# Patient Record
Sex: Female | Born: 1965
Health system: Southern US, Community
[De-identification: ages and names within clinical notes are randomized; demographics above are authoritative.]

## PROBLEM LIST (undated history)

## (undated) DIAGNOSIS — T7840XA Allergy, unspecified, initial encounter: Secondary | ICD-10-CM

## (undated) DIAGNOSIS — M1990 Unspecified osteoarthritis, unspecified site: Secondary | ICD-10-CM

## (undated) HISTORY — PX: UTERINE FIBROID EMBOLIZATION: SHX825

## (undated) HISTORY — DX: Allergy, unspecified, initial encounter: T78.40XA

## (undated) HISTORY — PX: TUBAL LIGATION: SHX77

---

## 1998-06-24 ENCOUNTER — Other Ambulatory Visit: Admission: RE | Admit: 1998-06-24 | Discharge: 1998-06-24 | Payer: Self-pay | Admitting: Obstetrics and Gynecology

## 2002-01-25 ENCOUNTER — Other Ambulatory Visit: Admission: RE | Admit: 2002-01-25 | Discharge: 2002-01-25 | Payer: Self-pay | Admitting: *Deleted

## 2003-04-02 ENCOUNTER — Other Ambulatory Visit: Admission: RE | Admit: 2003-04-02 | Discharge: 2003-04-02 | Payer: Self-pay | Admitting: *Deleted

## 2003-07-05 ENCOUNTER — Encounter (INDEPENDENT_AMBULATORY_CARE_PROVIDER_SITE_OTHER): Payer: Self-pay | Admitting: *Deleted

## 2003-07-05 ENCOUNTER — Ambulatory Visit (HOSPITAL_COMMUNITY): Admission: RE | Admit: 2003-07-05 | Discharge: 2003-07-05 | Payer: Self-pay | Admitting: *Deleted

## 2006-04-08 ENCOUNTER — Ambulatory Visit (HOSPITAL_COMMUNITY): Admission: RE | Admit: 2006-04-08 | Discharge: 2006-04-08 | Payer: Self-pay | Admitting: Family Medicine

## 2008-07-31 ENCOUNTER — Encounter: Admission: RE | Admit: 2008-07-31 | Discharge: 2008-07-31 | Payer: Self-pay | Admitting: Obstetrics and Gynecology

## 2008-08-04 ENCOUNTER — Encounter: Admission: RE | Admit: 2008-08-04 | Discharge: 2008-08-04 | Payer: Self-pay | Admitting: Interventional Radiology

## 2008-08-22 ENCOUNTER — Encounter: Admission: RE | Admit: 2008-08-22 | Discharge: 2008-08-22 | Payer: Self-pay | Admitting: Interventional Radiology

## 2008-09-10 ENCOUNTER — Ambulatory Visit (HOSPITAL_COMMUNITY): Admission: RE | Admit: 2008-09-10 | Discharge: 2008-09-11 | Payer: Self-pay | Admitting: Interventional Radiology

## 2008-10-01 ENCOUNTER — Encounter: Admission: RE | Admit: 2008-10-01 | Discharge: 2008-10-01 | Payer: Self-pay | Admitting: Interventional Radiology

## 2009-03-27 ENCOUNTER — Encounter: Admission: RE | Admit: 2009-03-27 | Discharge: 2009-03-27 | Payer: Self-pay | Admitting: Obstetrics and Gynecology

## 2009-04-02 ENCOUNTER — Encounter: Admission: RE | Admit: 2009-04-02 | Discharge: 2009-04-02 | Payer: Self-pay | Admitting: Interventional Radiology

## 2010-08-25 ENCOUNTER — Encounter: Admission: RE | Admit: 2010-08-25 | Discharge: 2010-08-25 | Payer: Self-pay | Admitting: Obstetrics and Gynecology

## 2011-05-07 NOTE — Op Note (Signed)
NAMEALTHIA, Rachel Graves                       ACCOUNT NO.:  [id number]   MEDICAL RECORD NO.:  [id number]                   PATIENT TYPE:  AMB   LOCATION:  SDC                                  FACILITY:  [facility]   PHYSICIAN:  Pershing Cox, M.D.            DATE OF BIRTH:  03-27-1966   DATE OF PROCEDURE:  07/05/2003  DATE OF DISCHARGE:                                 OPERATIVE REPORT   PREOPERATIVE DIAGNOSIS:  Menorrhagia with normal hydrosonogram.   POSTOPERATIVE DIAGNOSIS:  Menorrhagia with normal hydrosonogram.   ANESTHESIA:  MAC plus Marcaine paracervical block.   SURGEON:  Pershing Cox, M.D.   PROCEDURE:  Exam under anesthesia, dilation and curettage, hysteroscopy with  endometrial cryoablation.   COMPLICATIONS:  None.   INDICATIONS FOR PROCEDURE:  Rachel Graves Melendrez is a 45 year old female.  She  has had a tubal ligation in the past and has been having regular menstrual  periods but with three very heavy days of bleeding associated with this.  This has required pad change every 30 to 40 minutes.  She occasionally soils  at night.  She finds her symptoms debilitating.  Sonohysterogram was  performed and showed a normal endometrial lining and for this reason, the  patient was counseled for her options and is brought to the operating room  for endometrial cryoablation.   PROCEDURE:  The patient was brought to the operating room with an IV in  place.  She received a gram of Ancef in the holding area.  Supine on the OR  table, IV sedation was administered followed by placement of an LMA.  She  was then prepped with a solution of Hibiclens.  A Foley catheter was used to  empty the bladder because of an allergy to latex.  She was then was draped  for a sterile vaginal procedure.  A bivalve speculum was inserted into the  vagina.  The cervix was visualized.  Marcaine 0.25% was injected into the  anterior cervix which was grasped with a single tooth tenaculum.  20 mL of  0.25% Marcaine were injected into the paracervical tissues at the 3, 4, 7,  and 8 positions to establish a paracervical block.  The endocervical  curettings were collected.  The uterine uterine sound was then passed to a  depth of 9 cm.  Serial Pratt dilators were placed to dilate the endocervix.  A hysteroscope was inserted into the endometrial cavity and using Sorbitol  irrigation, the upper fundus and ostia were visualized.  There was no  evidence of a submucosal myoma which had been suggested on her sonogram.  With complete visualization and no evidence of significant tissue, the  cryoprobe was prefrozen and then inserted into the endometrial cavity.  First the left upper cornua were treated with a six minute freeze followed  by a 1 1/2 minute thaw.  The probe was rotated into the right fornix and the  bivalve speculum was  rotated so that there could be a good lateral extension  of the probe.  A six minute freeze was followed by 1 1/2 minute thaw.  I  then retracted the probe to a depth of 7 cm and carried out a midline  freeze, again with a six minute freeze followed by 1 1/2 minute thaw.  The  probe was removed after the final thaw.  The tenaculum was removed from the  uterus.  The patient was extubated and taken to the recovery room in  excellent condition.                                              Pershing Cox, M.D.   MAJ/MEDQ  D:  07/05/2003  T:  07/06/2003  Job:  [id number]

## 2011-09-20 LAB — CBC
MCV: 89.4
Platelets: 274

## 2012-06-12 ENCOUNTER — Other Ambulatory Visit: Payer: Self-pay | Admitting: Obstetrics and Gynecology

## 2012-06-29 ENCOUNTER — Other Ambulatory Visit: Payer: Self-pay | Admitting: Obstetrics and Gynecology

## 2012-06-29 DIAGNOSIS — Z1231 Encounter for screening mammogram for malignant neoplasm of breast: Secondary | ICD-10-CM

## 2012-07-25 ENCOUNTER — Ambulatory Visit
Admission: RE | Admit: 2012-07-25 | Discharge: 2012-07-25 | Disposition: A | Discharge status: Home / Self Care | Payer: 59 | Source: Ambulatory Visit | Attending: Obstetrics and Gynecology | Admitting: Obstetrics and Gynecology

## 2012-07-25 DIAGNOSIS — Z1231 Encounter for screening mammogram for malignant neoplasm of breast: Secondary | ICD-10-CM

## 2013-08-03 ENCOUNTER — Other Ambulatory Visit: Payer: Self-pay

## 2013-08-03 DIAGNOSIS — Z1231 Encounter for screening mammogram for malignant neoplasm of breast: Secondary | ICD-10-CM

## 2013-08-16 ENCOUNTER — Ambulatory Visit
Admission: RE | Admit: 2013-08-16 | Discharge: 2013-08-16 | Disposition: A | Discharge status: Home / Self Care | Payer: 59 | Source: Ambulatory Visit

## 2013-08-16 DIAGNOSIS — Z1231 Encounter for screening mammogram for malignant neoplasm of breast: Secondary | ICD-10-CM

## 2013-11-24 ENCOUNTER — Emergency Department (HOSPITAL_COMMUNITY)
Admission: EM | Admit: 2013-11-24 | Discharge: 2013-11-24 | Disposition: A | Discharge status: Home / Self Care | Payer: 59 | Attending: Emergency Medicine | Admitting: Emergency Medicine

## 2013-11-24 ENCOUNTER — Encounter (HOSPITAL_COMMUNITY): Payer: Self-pay | Admitting: Emergency Medicine

## 2013-11-24 DIAGNOSIS — S0990XA Unspecified injury of head, initial encounter: Secondary | ICD-10-CM | POA: Insufficient documentation

## 2013-11-24 DIAGNOSIS — Z91040 Latex allergy status: Secondary | ICD-10-CM | POA: Insufficient documentation

## 2013-11-24 DIAGNOSIS — Z79899 Other long term (current) drug therapy: Secondary | ICD-10-CM | POA: Insufficient documentation

## 2013-11-24 DIAGNOSIS — Z8739 Personal history of other diseases of the musculoskeletal system and connective tissue: Secondary | ICD-10-CM | POA: Insufficient documentation

## 2013-11-24 DIAGNOSIS — Y92410 Unspecified street and highway as the place of occurrence of the external cause: Secondary | ICD-10-CM | POA: Insufficient documentation

## 2013-11-24 DIAGNOSIS — Y9389 Activity, other specified: Secondary | ICD-10-CM | POA: Insufficient documentation

## 2013-11-24 DIAGNOSIS — Z880 Allergy status to penicillin: Secondary | ICD-10-CM | POA: Insufficient documentation

## 2013-11-24 DIAGNOSIS — S8990XA Unspecified injury of unspecified lower leg, initial encounter: Secondary | ICD-10-CM | POA: Insufficient documentation

## 2013-11-24 HISTORY — DX: Unspecified osteoarthritis, unspecified site: M19.90
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
## 2013-11-25 NOTE — ED Provider Notes (Signed)
  Medical screening examination/treatment/procedure(s) were performed by non-physician practitioner and as supervising physician I was immediately available for consultation/collaboration.  EKG Interpretation   None          Avey Mcmanamon, MD 11/25/13 0023 

## 2014-04-12 ENCOUNTER — Other Ambulatory Visit (HOSPITAL_COMMUNITY): Payer: Self-pay

## 2014-04-12 DIAGNOSIS — J45909 Unspecified asthma, uncomplicated: Secondary | ICD-10-CM

## 2014-05-23 ENCOUNTER — Ambulatory Visit (HOSPITAL_COMMUNITY)
Admission: RE | Admit: 2014-05-23 | Discharge: 2014-05-23 | Disposition: A | Discharge status: Home / Self Care | Payer: 59 | Source: Ambulatory Visit | Attending: Family Medicine | Admitting: Family Medicine

## 2014-05-23 DIAGNOSIS — J45909 Unspecified asthma, uncomplicated: Secondary | ICD-10-CM | POA: Insufficient documentation

## 2014-05-23 MED ORDER — ALBUTEROL SULFATE (2.5 MG/3ML) 0.083% IN NEBU
2.5000 mg | INHALATION_SOLUTION | Freq: Once | RESPIRATORY_TRACT | Status: AC
  Administered 2014-05-23: 2.5 mg via RESPIRATORY_TRACT

## 2014-06-17 LAB — PULMONARY FUNCTION TEST
DL/VA % PRED: 108 %
DL/VA: 5.46 ml/min/mmHg/L
DLCO UNC % PRED: 90 %
DLCO unc: 24.33 ml/min/mmHg
FEF 25-75 Post: 2.76 L/sec
FEF 25-75 Pre: 2.08 L/sec
FEF2575-%Change-Post: 32 %
FEF2575-%PRED-POST: 102 %
FEF2575-%Pred-Pre: 77 %
FEV1-%Change-Post: 11 %
FEV1-%Pred-Post: 101 %
FEV1-%Pred-Pre: 91 %
FEV1-PRE: 2.33 L
FEV1-Post: 2.61 L
FEV1FVC-%Change-Post: 2 %
FEV1FVC-%PRED-PRE: 92 %
FEV6-%Change-Post: 9 %
FEV6-%PRED-POST: 108 %
FEV6-%Pred-Pre: 98 %
FEV6-POST: 3.35 L
FEV6-Pre: 3.07 L
FEV6FVC-%CHANGE-POST: 0 %
FEV6FVC-%PRED-POST: 103 %
FEV6FVC-%Pred-Pre: 102 %
FVC-%Change-Post: 8 %
FVC-%PRED-PRE: 96 %
FVC-%Pred-Post: 105 %
FVC-PRE: 3.08 L
FVC-Post: 3.35 L
POST FEV1/FVC RATIO: 78 %
PRE FEV6/FVC RATIO: 99 %
Post FEV6/FVC ratio: 100 %
Pre FEV1/FVC ratio: 76 %
RV % pred: 89 %
RV: 1.65 L
TLC % PRED: 93 %
TLC: 4.99 L

## 2014-09-08 ENCOUNTER — Ambulatory Visit (INDEPENDENT_AMBULATORY_CARE_PROVIDER_SITE_OTHER): Payer: 59

## 2014-09-08 ENCOUNTER — Ambulatory Visit (INDEPENDENT_AMBULATORY_CARE_PROVIDER_SITE_OTHER): Payer: 59 | Admitting: Family Medicine

## 2014-09-08 VITALS — BP 122/76 | HR 72 | Temp 97.8°F | Resp 18 | Ht 66.0 in | Wt 213.6 lb

## 2014-09-08 DIAGNOSIS — S93609A Unspecified sprain of unspecified foot, initial encounter: Secondary | ICD-10-CM

## 2014-09-08 DIAGNOSIS — S93602A Unspecified sprain of left foot, initial encounter: Secondary | ICD-10-CM

## 2014-09-08 MED ORDER — PREDNISONE 20 MG PO TABS: 40.0000 mg | ORAL_TABLET | Freq: Every day | ORAL | Status: DC

## 2014-09-08 NOTE — Progress Notes (Signed)
Patient ID: Rachel Graves MRN: [id number], DOB: 16-Sep-1966, 48 y.o. Date of Encounter: 09/08/2014, 10:47 AM  Primary Physician: Astrid Divine, MD  Chief Complaint:  Chief Complaint  Patient presents with  . Foot pain    last week of August--left side of the left foot--sharp pain--limited ROM--painful with movement  . Ankle Pain    left ankle   This chart was scribed for Dr. Elvina Sidle, MD by Jarvis Morgan, Medical Scribe. This patient was seen in Room 11 and the patient's care was started at 10:50 AM.   HPI: 48 y.o. year old female with history below presents with of constant, sharp, gradually worsening, left foot pain for approximately 3 weeks. She states that the pain is localized in the top of her foot. Pt reports that about 1 month ago that she tripped on some pavers and has been having pain ever since. She states that the pain is exacerbated by movement and that the pain gets gradually worse the longer that she is on her feet. Pt also notes she is not able to move it as much as before. She has been taking Ibuprofen with no relief. She denies any swelling or color change to the area.  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 Arthritis   . Allergy      Home Meds: Prior to Admission medications   Medication Sig Start Date End Date Taking? Authorizing Provider  albuterol (PROVENTIL HFA;VENTOLIN HFA) 108 (90 BASE) MCG/ACT inhaler Inhale into the lungs every 6 (six) hours as needed for wheezing or shortness of breath.   Yes Historical Provider, MD    Allergies:  Allergies  Allergen Reactions  . Latex Hives  . Penicillins     History   Social History  . Marital Status: Married    Spouse Name: N/A    Number of Children: N/A  . Years of Education: N/A   Occupational History  . Not on file.   Social History Main Topics  . Smoking status: Never Smoker   . Smokeless tobacco: Not on file  . Alcohol Use: No   . Drug Use: No  . Sexual Activity: Yes   Other Topics Concern  . Not on file   Social History Narrative  . No narrative on file     Review of Systems: Constitutional: negative for chills, fever, night sweats, weight changes, or fatigue  HEENT: negative for vision changes, hearing loss, congestion, rhinorrhea, ST, epistaxis, or sinus pressure Cardiovascular: negative for chest pain or palpitations Respiratory: negative for hemoptysis, wheezing, shortness of breath, or cough Abdominal: negative for abdominal pain, nausea, vomiting, diarrhea, or constipation Dermatological: negative for rash, or color change Neurologic: negative for headache, dizziness, or syncope Musculoskeletal: positive for arthralgias (left foot). Negative for joint swelling. All other systems reviewed and are otherwise negative with the exception to those above and in the HPI.   Physical Exam: Blood pressure 122/76, pulse 72, temperature 97.8 F (36.6 C), temperature source Oral, resp. rate 18, height  (1.676 m), weight 213 lb 9.6 oz (96.888 kg), SpO2 100.00%., Body mass index is 34.49 kg/(m^2). General: Well developed, well nourished, in no acute distress. Head: Normocephalic, atraumatic, eyes without discharge, sclera non-icteric, nares are without discharge. Bilateral auditory canals clear, TM's are without perforation, pearly grey and translucent with reflective cone of light bilaterally. Oral cavity moist, posterior pharynx without exudate, erythema, peritonsillar abscess, or post nasal drip.  Neck: Supple.  No thyromegaly. Full ROM. No lymphadenopathy. Lungs: Clear bilaterally to auscultation without wheezes, rales, or rhonchi. Breathing is unlabored. Heart: RRR with S1 S2. No murmurs, rubs, or gallops appreciated. Abdomen: Soft, non-tender, non-distended with normoactive bowel sounds. No hepatomegaly. No rebound/guarding. No obvious abdominal masses. Msk:  Strength and tone normal for  age. Extremities/Skin: Warm and dry. No clubbing or cyanosis. No edema. No rashes or suspicious lesions. Neuro: Alert and oriented X 3. Moves all extremities spontaneously. Gait is normal. CNII-XII grossly in tact. Psych:  Responds to questions appropriately with a normal affect.   Labs: [facility] reading (PRIMARY) by  Dr. Milus Glazier: Normal left foot.    ASSESSMENT AND PLAN:  48 y.o. year old female with   1. Foot sprain, left, initial encounter    Foot sprain, left, initial encounter - Plan: DG Foot Complete Left, predniSONE (DELTASONE) 20 MG tablet     Signed, Elvina Sidle, MD 09/08/2014 10:47 AM

## 2014-10-24 ENCOUNTER — Other Ambulatory Visit: Payer: Self-pay

## 2014-10-24 DIAGNOSIS — Z1231 Encounter for screening mammogram for malignant neoplasm of breast: Secondary | ICD-10-CM

## 2014-11-06 ENCOUNTER — Ambulatory Visit
Admission: RE | Admit: 2014-11-06 | Discharge: 2014-11-06 | Disposition: A | Discharge status: Home / Self Care | Payer: 59 | Source: Ambulatory Visit

## 2014-11-06 DIAGNOSIS — Z1231 Encounter for screening mammogram for malignant neoplasm of breast: Secondary | ICD-10-CM

## 2014-11-07 ENCOUNTER — Ambulatory Visit: Payer: 59

## 2016-01-30 ENCOUNTER — Other Ambulatory Visit: Payer: Self-pay

## 2016-01-30 DIAGNOSIS — Z1231 Encounter for screening mammogram for malignant neoplasm of breast: Secondary | ICD-10-CM

## 2016-02-11 ENCOUNTER — Ambulatory Visit
Admission: RE | Admit: 2016-02-11 | Discharge: 2016-02-11 | Disposition: A | Discharge status: Home / Self Care | Payer: BLUE CROSS/BLUE SHIELD | Source: Ambulatory Visit

## 2016-02-11 DIAGNOSIS — Z1231 Encounter for screening mammogram for malignant neoplasm of breast: Secondary | ICD-10-CM

## 2017-12-23 DIAGNOSIS — Z23 Encounter for immunization: Secondary | ICD-10-CM | POA: Diagnosis not present

## 2017-12-23 DIAGNOSIS — Z136 Encounter for screening for cardiovascular disorders: Secondary | ICD-10-CM | POA: Diagnosis not present

## 2017-12-23 DIAGNOSIS — Z0000 Encounter for general adult medical examination without abnormal findings: Secondary | ICD-10-CM | POA: Diagnosis not present

## 2018-01-10 DIAGNOSIS — Z800 Family history of malignant neoplasm of digestive organs: Secondary | ICD-10-CM | POA: Diagnosis not present

## 2018-01-10 DIAGNOSIS — Z1211 Encounter for screening for malignant neoplasm of colon: Secondary | ICD-10-CM | POA: Diagnosis not present

## 2018-02-24 DIAGNOSIS — Z1211 Encounter for screening for malignant neoplasm of colon: Secondary | ICD-10-CM | POA: Diagnosis not present

## 2018-02-24 DIAGNOSIS — Z800 Family history of malignant neoplasm of digestive organs: Secondary | ICD-10-CM | POA: Diagnosis not present

## 2018-03-01 ENCOUNTER — Other Ambulatory Visit: Payer: Self-pay | Admitting: Family Medicine

## 2018-03-01 DIAGNOSIS — Z1231 Encounter for screening mammogram for malignant neoplasm of breast: Secondary | ICD-10-CM

## 2018-03-04 ENCOUNTER — Encounter (HOSPITAL_COMMUNITY): Payer: Self-pay

## 2018-03-04 ENCOUNTER — Ambulatory Visit (HOSPITAL_COMMUNITY)
Admission: EM | Admit: 2018-03-04 | Discharge: 2018-03-04 | Disposition: A | Discharge status: Home / Self Care | Payer: BLUE CROSS/BLUE SHIELD | Attending: Family Medicine | Admitting: Family Medicine

## 2018-03-04 ENCOUNTER — Other Ambulatory Visit: Payer: Self-pay

## 2018-03-04 DIAGNOSIS — M546 Pain in thoracic spine: Secondary | ICD-10-CM

## 2018-03-04 MED ORDER — CYCLOBENZAPRINE HCL 10 MG PO TABS: 10.0000 mg | ORAL_TABLET | Freq: Two times a day (BID) | ORAL | 0 refills | Status: AC | PRN
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
## 2018-03-09 DIAGNOSIS — R030 Elevated blood-pressure reading, without diagnosis of hypertension: Secondary | ICD-10-CM | POA: Diagnosis not present

## 2018-03-09 DIAGNOSIS — R0789 Other chest pain: Secondary | ICD-10-CM | POA: Diagnosis not present

## 2018-03-17 ENCOUNTER — Ambulatory Visit: Payer: BLUE CROSS/BLUE SHIELD

## 2018-04-11 DIAGNOSIS — R030 Elevated blood-pressure reading, without diagnosis of hypertension: Secondary | ICD-10-CM | POA: Diagnosis not present

## 2018-04-24 ENCOUNTER — Ambulatory Visit
Admission: RE | Admit: 2018-04-24 | Discharge: 2018-04-24 | Disposition: A | Discharge status: Home / Self Care | Payer: Self-pay | Source: Ambulatory Visit | Attending: Family Medicine | Admitting: Family Medicine

## 2018-04-24 DIAGNOSIS — Z1231 Encounter for screening mammogram for malignant neoplasm of breast: Secondary | ICD-10-CM | POA: Diagnosis not present

## 2019-09-26 ENCOUNTER — Other Ambulatory Visit: Payer: Self-pay | Admitting: Family Medicine

## 2019-09-26 DIAGNOSIS — Z1231 Encounter for screening mammogram for malignant neoplasm of breast: Secondary | ICD-10-CM

## 2019-10-19 ENCOUNTER — Ambulatory Visit
Admission: RE | Admit: 2019-10-19 | Discharge: 2019-10-19 | Disposition: A | Discharge status: Home / Self Care | Payer: 59 | Source: Ambulatory Visit | Attending: Family Medicine | Admitting: Family Medicine

## 2019-10-19 ENCOUNTER — Other Ambulatory Visit: Payer: Self-pay

## 2019-10-19 DIAGNOSIS — Z1231 Encounter for screening mammogram for malignant neoplasm of breast: Secondary | ICD-10-CM

## 2021-01-01 ENCOUNTER — Other Ambulatory Visit: Payer: Self-pay | Admitting: Family Medicine

## 2021-01-01 DIAGNOSIS — Z0000 Encounter for general adult medical examination without abnormal findings: Secondary | ICD-10-CM

## 2021-02-13 ENCOUNTER — Ambulatory Visit
Admission: RE | Admit: 2021-02-13 | Discharge: 2021-02-13 | Disposition: A | Discharge status: Home / Self Care | Payer: 59 | Source: Ambulatory Visit | Attending: Family Medicine | Admitting: Family Medicine

## 2021-02-13 ENCOUNTER — Other Ambulatory Visit: Payer: Self-pay

## 2021-02-13 DIAGNOSIS — Z0000 Encounter for general adult medical examination without abnormal findings: Secondary | ICD-10-CM

## 2021-07-17 IMAGING — MG DIGITAL SCREENING BILAT W/ CAD
4 series · 4 of 4 positions shown · non-contrast
Comparison: Previous exam(s).

CLINICAL DATA: Screening.

EXAM:
DIGITAL SCREENING BILATERAL MAMMOGRAM WITH CAD
TECHNIQUE: Bilateral screening digital craniocaudal and mediolateral oblique
mammograms were obtained. The images were evaluated with
computer-aided detection.

[L MLO]
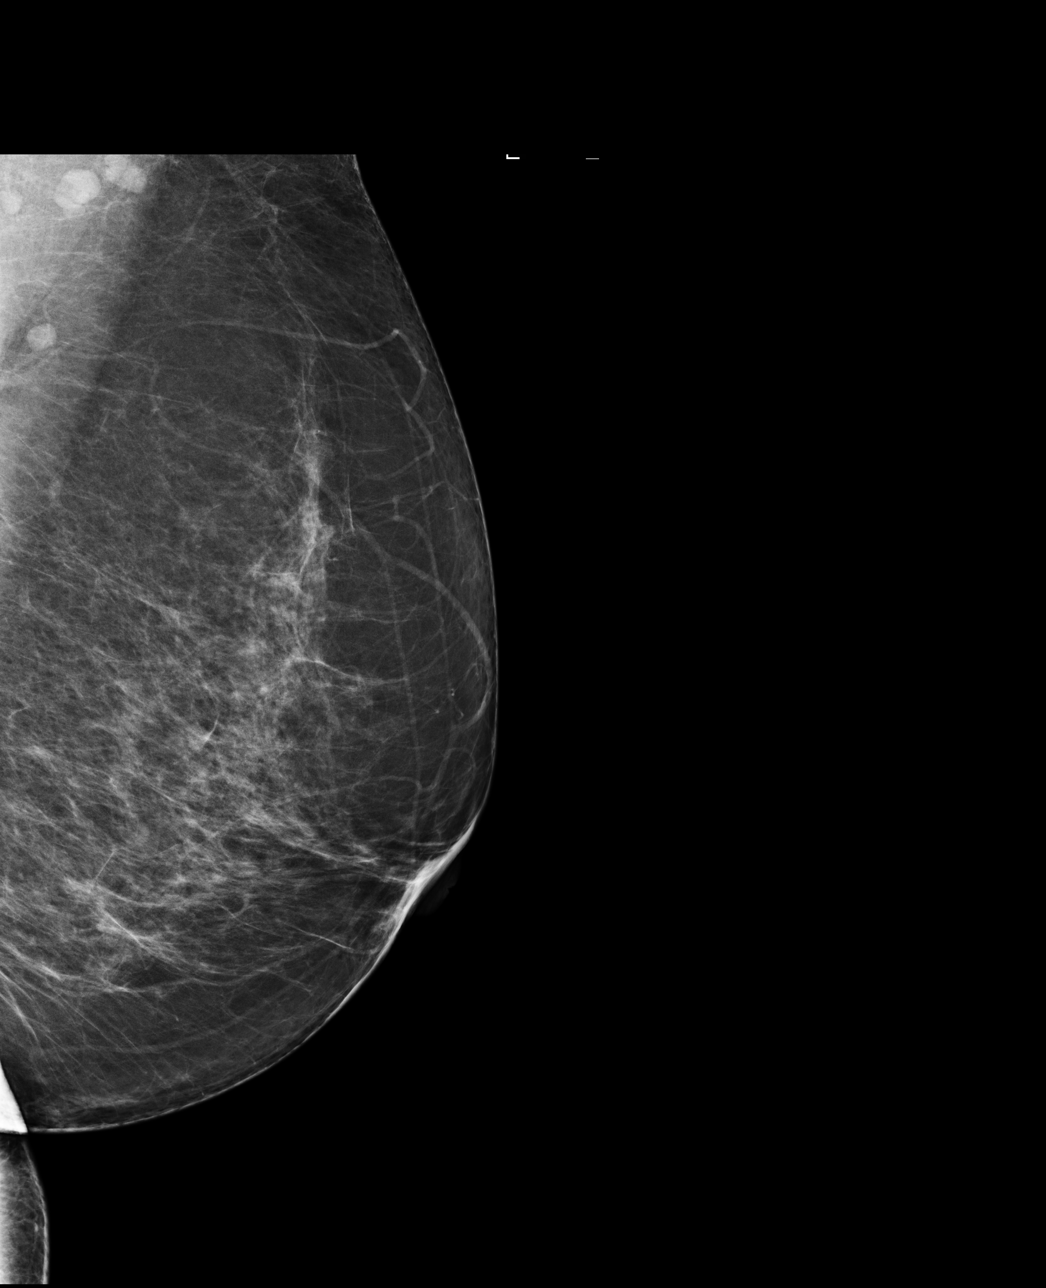

[L CC]
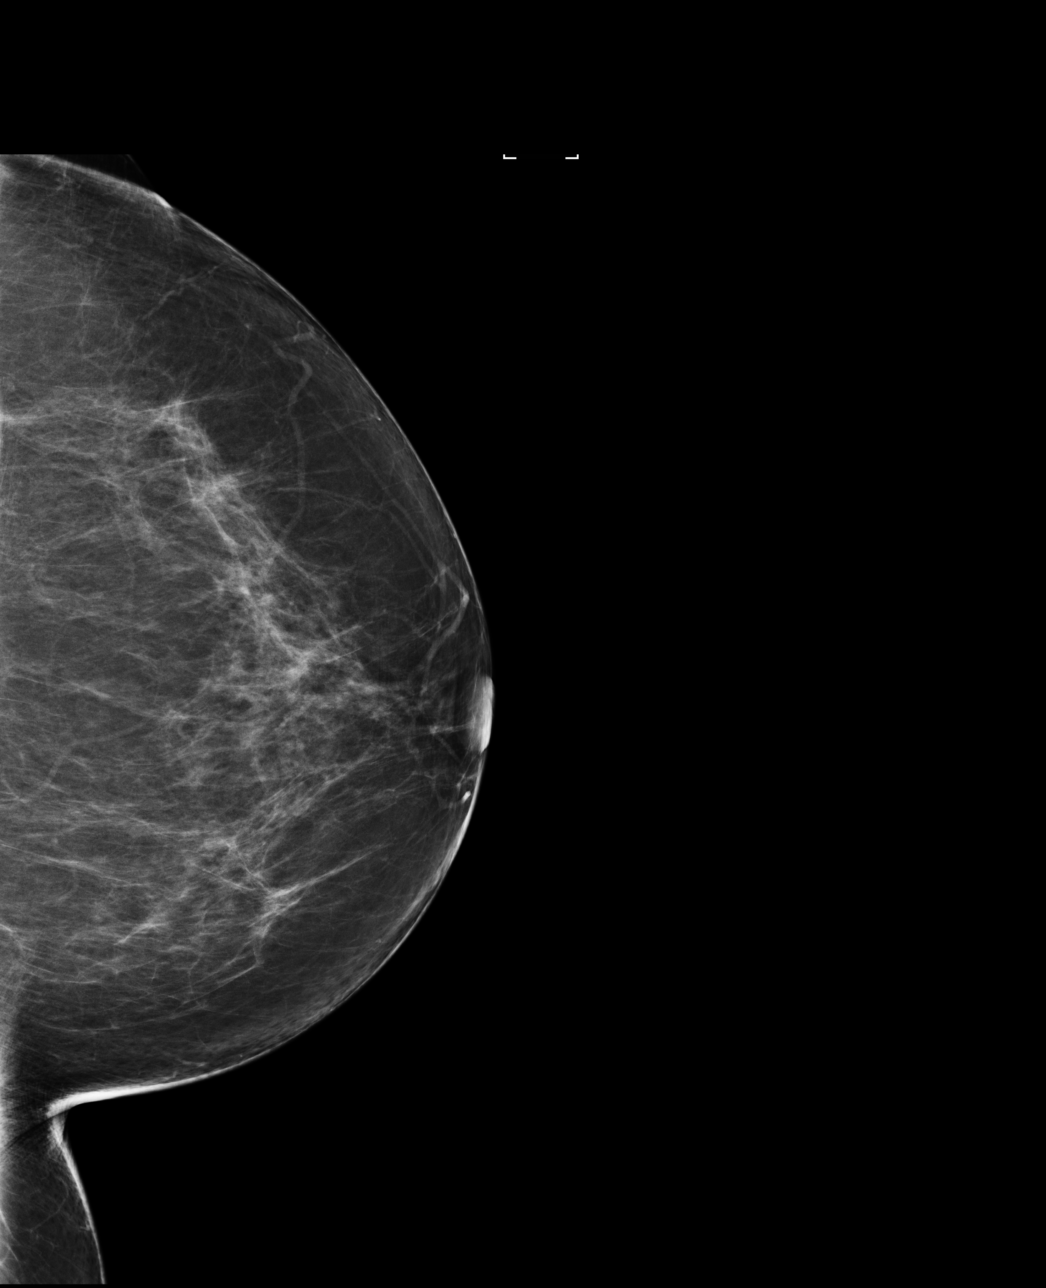

[R CC]
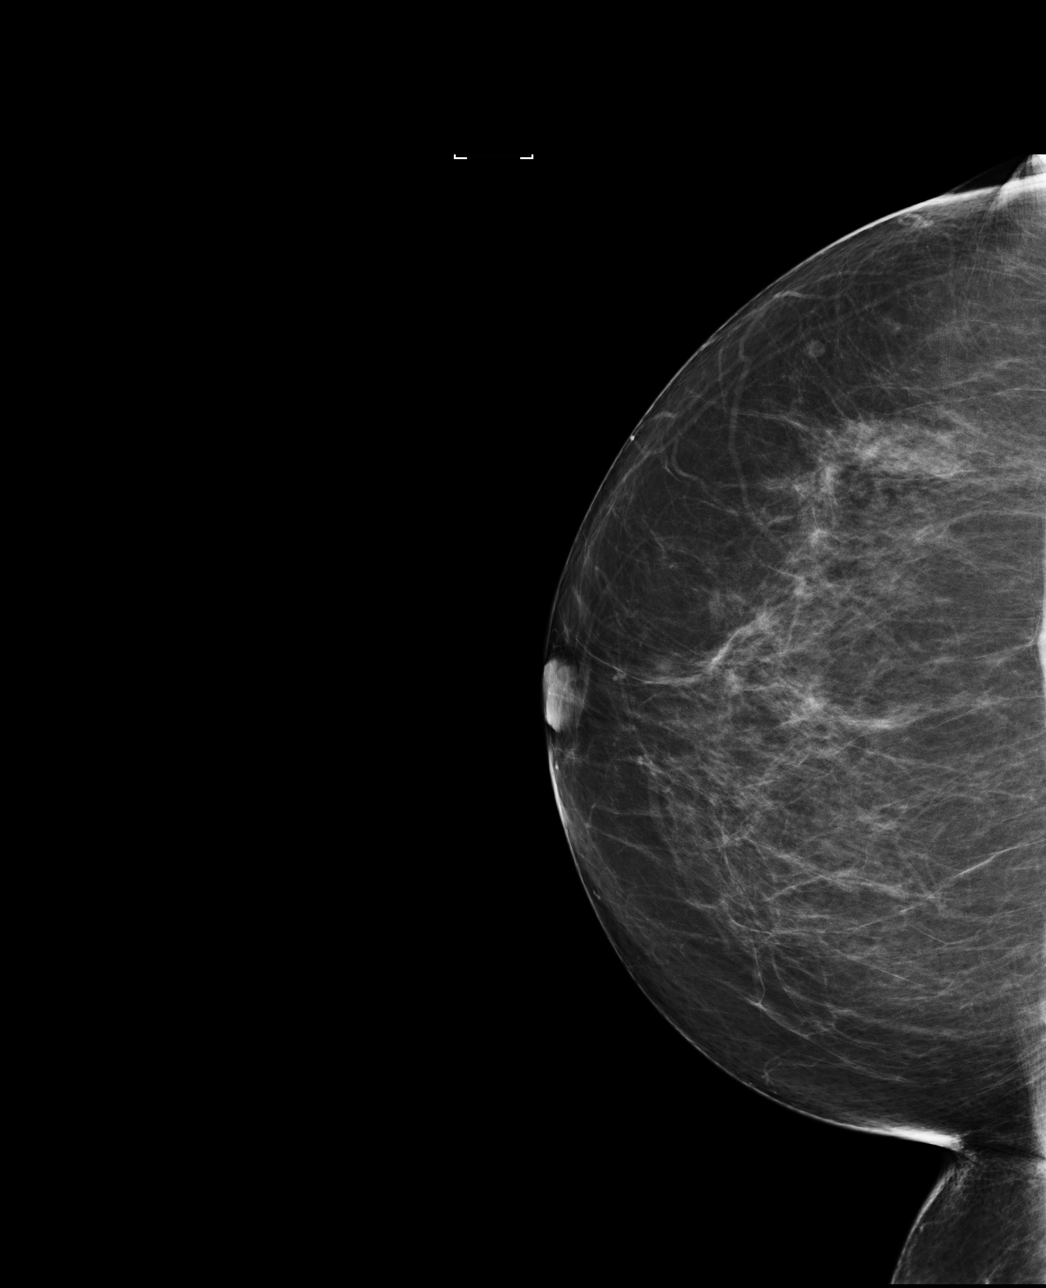

[R MLO]
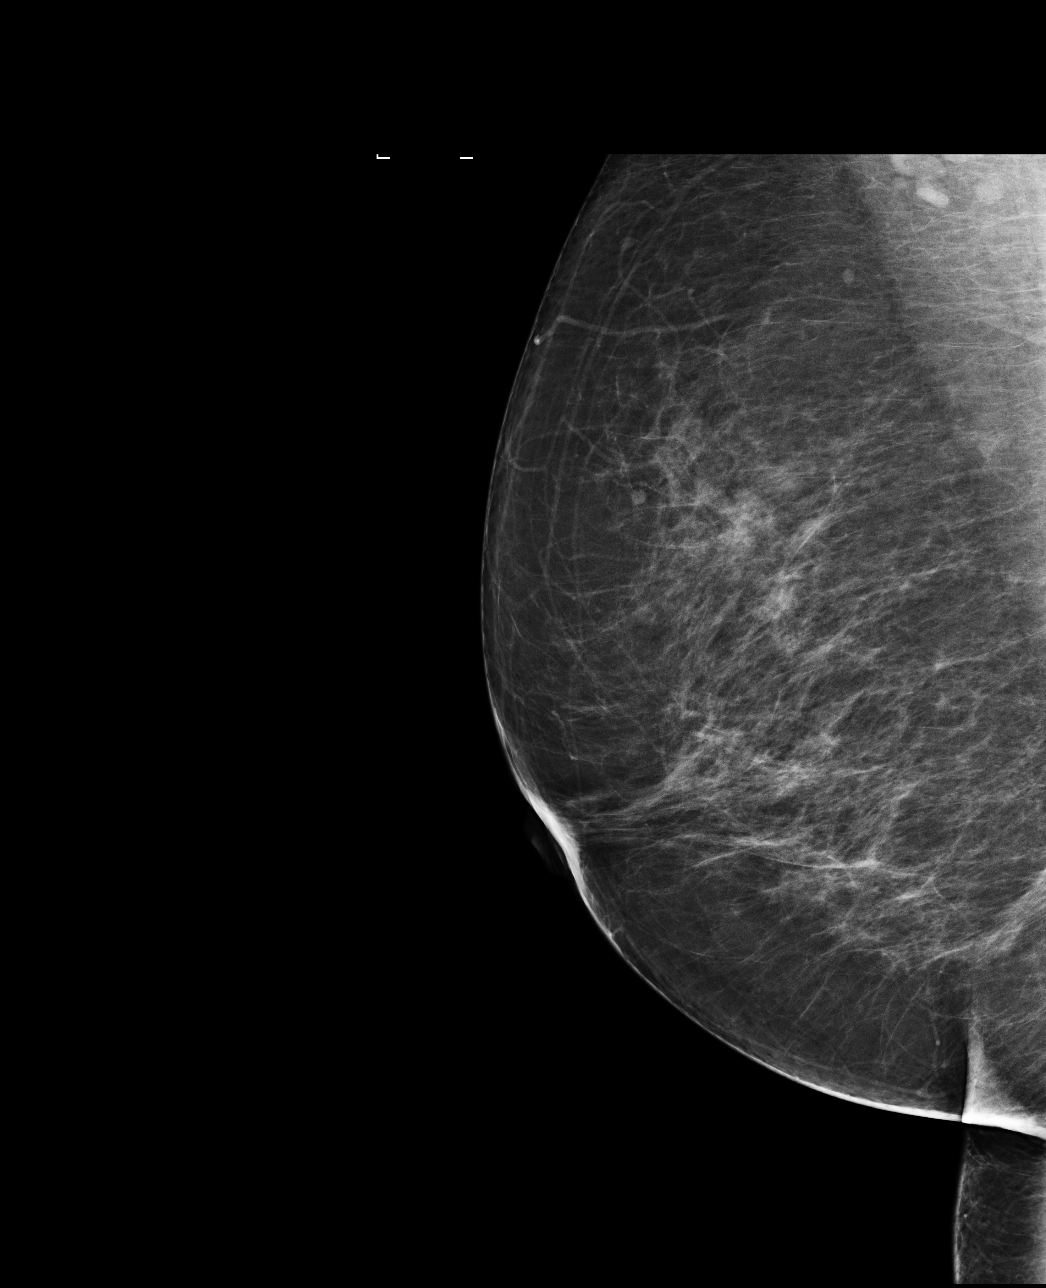

[4 of 4 positions shown; findings below may reference images not displayed]

ACR Breast Density Category b: There are scattered areas of
fibroglandular density.
FINDINGS: There are no findings suspicious for malignancy. The images were
evaluated with computer-aided detection.
IMPRESSION: No mammographic evidence of malignancy. A result letter of this
screening mammogram will be mailed directly to the patient.

RECOMMENDATION:
Screening mammogram in one year. (Code:XC-Q-XW6)

BI-RADS CATEGORY  1: Negative.

## 2022-03-26 ENCOUNTER — Other Ambulatory Visit: Payer: Self-pay | Admitting: Family Medicine

## 2022-03-26 DIAGNOSIS — Z1231 Encounter for screening mammogram for malignant neoplasm of breast: Secondary | ICD-10-CM

## 2022-04-13 ENCOUNTER — Ambulatory Visit: Payer: 59

## 2022-07-30 ENCOUNTER — Ambulatory Visit
Admission: RE | Admit: 2022-07-30 | Discharge: 2022-07-30 | Disposition: A | Discharge status: Home / Self Care | Payer: 59 | Source: Ambulatory Visit | Attending: Family Medicine | Admitting: Family Medicine

## 2022-07-30 ENCOUNTER — Encounter: Payer: Self-pay | Admitting: Radiology

## 2022-07-30 DIAGNOSIS — Z1231 Encounter for screening mammogram for malignant neoplasm of breast: Secondary | ICD-10-CM

## 2022-08-03 ENCOUNTER — Other Ambulatory Visit: Payer: Self-pay | Admitting: Family Medicine

## 2022-08-03 DIAGNOSIS — R928 Other abnormal and inconclusive findings on diagnostic imaging of breast: Secondary | ICD-10-CM

## 2022-08-12 ENCOUNTER — Ambulatory Visit
Admission: RE | Admit: 2022-08-12 | Discharge: 2022-08-12 | Disposition: A | Discharge status: Home / Self Care | Payer: 59 | Source: Ambulatory Visit | Attending: Family Medicine | Admitting: Family Medicine

## 2022-08-12 DIAGNOSIS — R928 Other abnormal and inconclusive findings on diagnostic imaging of breast: Secondary | ICD-10-CM

## 2023-09-22 ENCOUNTER — Other Ambulatory Visit: Payer: Self-pay | Admitting: Internal Medicine

## 2023-09-22 DIAGNOSIS — Z1231 Encounter for screening mammogram for malignant neoplasm of breast: Secondary | ICD-10-CM

## 2023-10-18 ENCOUNTER — Ambulatory Visit
Admission: RE | Admit: 2023-10-18 | Discharge: 2023-10-18 | Disposition: A | Discharge status: Home / Self Care | Payer: 59 | Source: Ambulatory Visit | Attending: Internal Medicine | Admitting: Internal Medicine

## 2023-10-18 DIAGNOSIS — Z1231 Encounter for screening mammogram for malignant neoplasm of breast: Secondary | ICD-10-CM

## 2023-10-19 ENCOUNTER — Ambulatory Visit: Payer: 59

## 2023-11-09 ENCOUNTER — Ambulatory Visit: Payer: 59

## 2024-10-29 ENCOUNTER — Other Ambulatory Visit: Payer: Self-pay | Admitting: Internal Medicine

## 2024-10-29 DIAGNOSIS — Z1231 Encounter for screening mammogram for malignant neoplasm of breast: Secondary | ICD-10-CM

## 2024-11-27 ENCOUNTER — Ambulatory Visit

## 2024-12-25 ENCOUNTER — Ambulatory Visit

## 2024-12-26 ENCOUNTER — Ambulatory Visit
Admission: RE | Admit: 2024-12-26 | Discharge: 2024-12-26 | Disposition: A | Discharge status: Home / Self Care | Source: Ambulatory Visit | Attending: Internal Medicine | Admitting: Internal Medicine

## 2024-12-26 DIAGNOSIS — Z1231 Encounter for screening mammogram for malignant neoplasm of breast: Secondary | ICD-10-CM
